# Patient Record
Sex: Male | Born: 1980 | Race: Black or African American | Hispanic: No | Marital: Married | Smoking: Never smoker
Health system: Southern US, Community
[De-identification: ages and names within clinical notes are randomized; demographics above are authoritative.]

## PROBLEM LIST (undated history)

## (undated) HISTORY — PX: BACK SURGERY: SHX140

---

## 2009-06-30 ENCOUNTER — Ambulatory Visit: Payer: Self-pay | Admitting: Diagnostic Radiology

## 2009-06-30 ENCOUNTER — Emergency Department (HOSPITAL_BASED_OUTPATIENT_CLINIC_OR_DEPARTMENT_OTHER): Admission: EM | Admit: 2009-06-30 | Discharge: 2009-06-30 | Payer: Self-pay | Admitting: Emergency Medicine

## 2010-03-31 ENCOUNTER — Ambulatory Visit (HOSPITAL_COMMUNITY): Admission: RE | Admit: 2010-03-31 | Discharge: 2010-04-01 | Payer: Self-pay | Admitting: Specialist

## 2010-09-02 ENCOUNTER — Emergency Department (HOSPITAL_COMMUNITY): Admission: EM | Admit: 2010-09-02 | Discharge: 2010-09-03 | Payer: Self-pay | Admitting: Emergency Medicine

## 2010-09-03 ENCOUNTER — Emergency Department (HOSPITAL_COMMUNITY): Admission: EM | Admit: 2010-09-03 | Discharge: 2010-09-04 | Payer: Self-pay | Admitting: Emergency Medicine

## 2011-02-08 LAB — CBC
HCT: 44.6 % (ref 39.0–52.0)
MCHC: 33 g/dL (ref 30.0–36.0)
MCV: 90.4 fL (ref 78.0–100.0)
Platelets: 207 10*3/uL (ref 150–400)
WBC: 5.7 10*3/uL (ref 4.0–10.5)

## 2011-02-08 LAB — URINALYSIS, ROUTINE W REFLEX MICROSCOPIC
Bilirubin Urine: NEGATIVE
Ketones, ur: NEGATIVE mg/dL
Nitrite: NEGATIVE
Specific Gravity, Urine: 1.007 (ref 1.005–1.030)
Urobilinogen, UA: 1 mg/dL (ref 0.0–1.0)

## 2011-02-08 LAB — COMPREHENSIVE METABOLIC PANEL
Albumin: 4.2 g/dL (ref 3.5–5.2)
BUN: 7 mg/dL (ref 6–23)
Calcium: 9.4 mg/dL (ref 8.4–10.5)
Chloride: 104 mEq/L (ref 96–112)
Creatinine, Ser: 1.14 mg/dL (ref 0.4–1.5)
GFR calc Af Amer: >60 mL/min (ref >60)
GFR calc non Af Amer: >60 mL/min (ref >60)
Total Bilirubin: 0.9 mg/dL (ref 0.3–1.2)

## 2011-02-08 LAB — PROTIME-INR: INR: 1.1 (ref 0.00–1.49)

## 2011-02-08 LAB — APTT: aPTT: 27 seconds (ref 24–37)

## 2011-04-11 IMAGING — CR DG CHEST 2V
2 series · 2 of 2 positions shown · non-contrast
Comparison: None

CLINICAL DATA: Preop for lumbar spine surgery.

CHEST - 2 VIEW

[w chest pa]
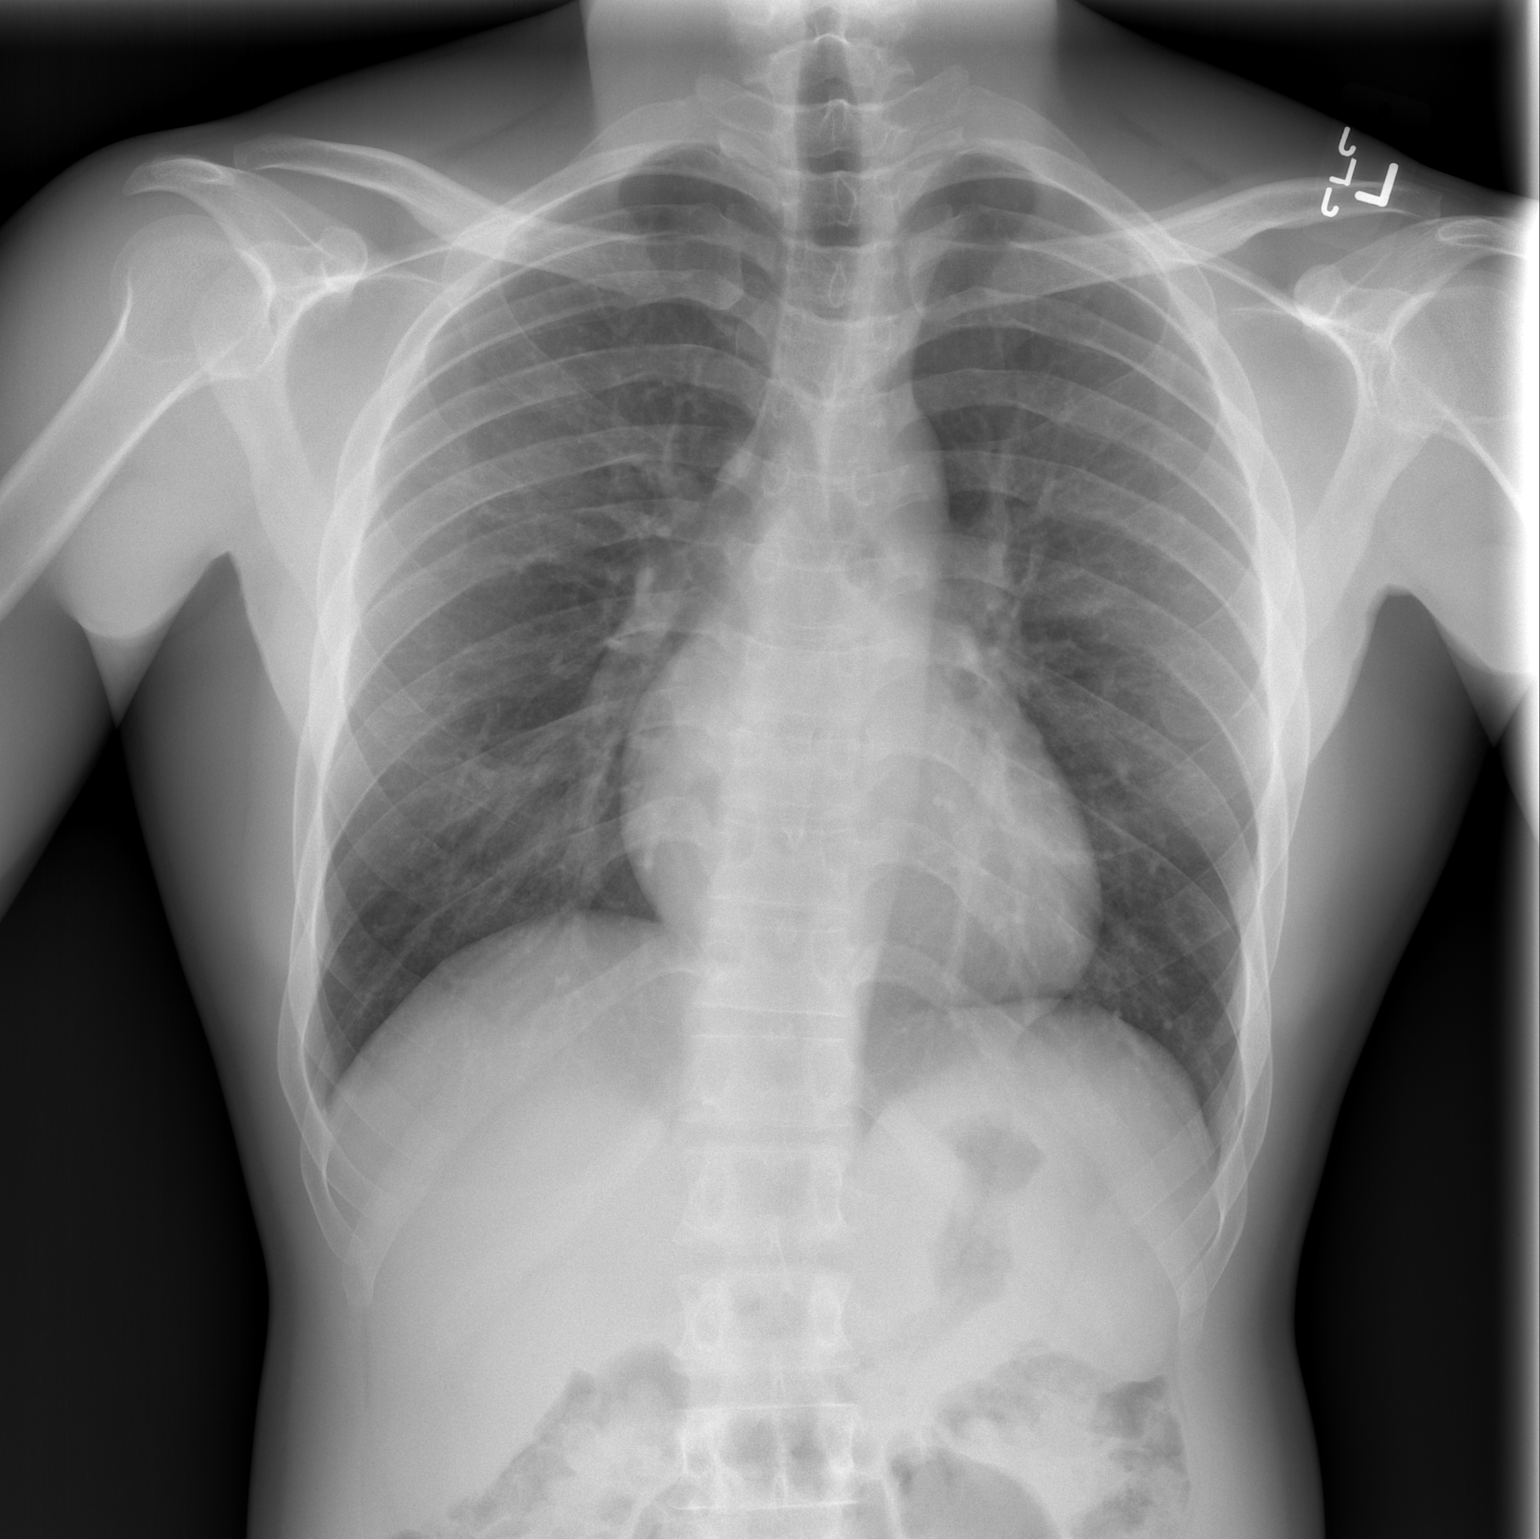

[w chest lat]
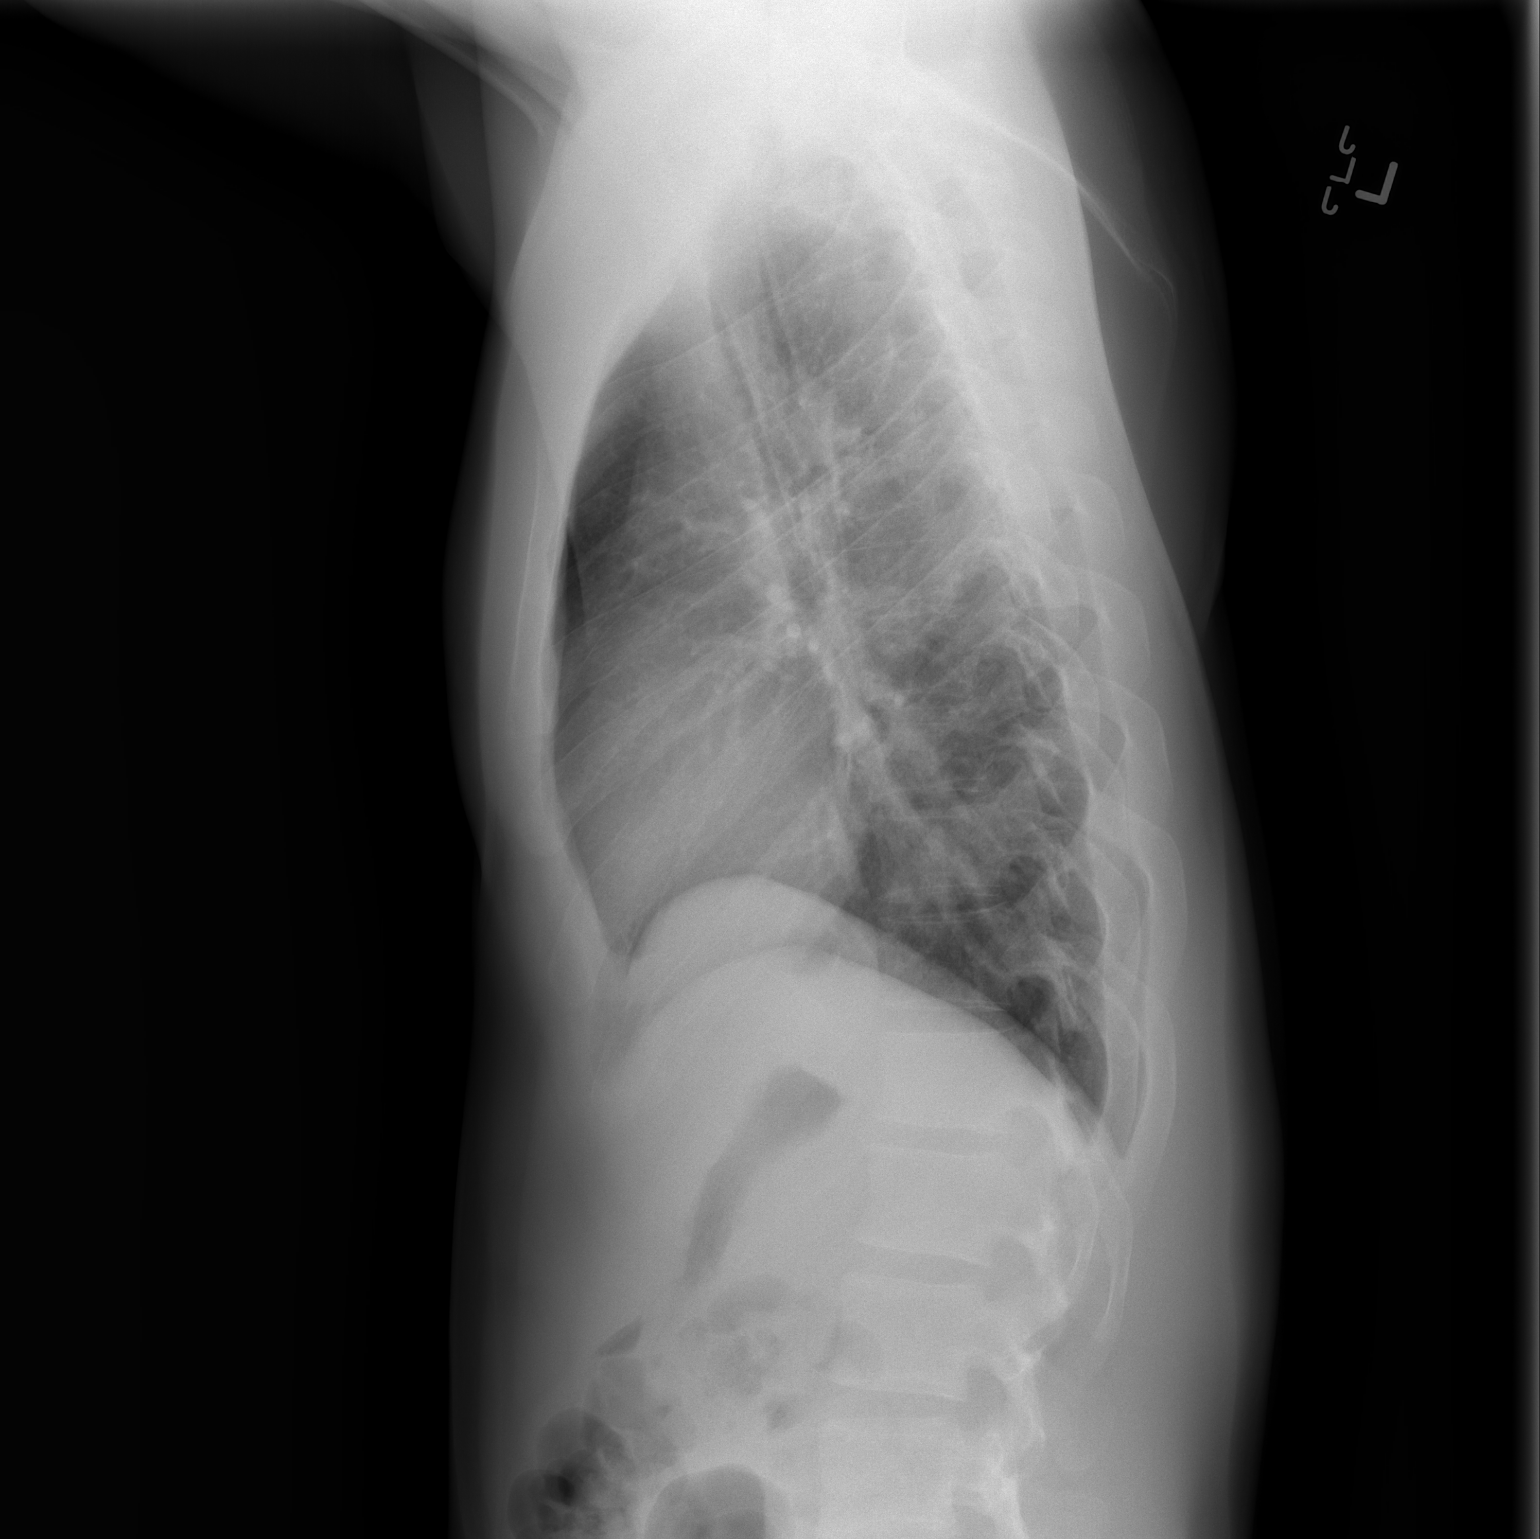

[2 of 2 positions shown; findings below may reference images not displayed]

FINDINGS: The cardiac silhouette, mediastinal and hilar contours
are within normal limits.  The lungs are clear.  The bony thorax is
intact.
IMPRESSION: Normal chest x-ray.

## 2011-04-11 IMAGING — CR DG LUMBAR SPINE 2-3V
2 series · 2 of 2 positions shown · non-contrast
Comparison: Lumbar spine series 06/30/2009

CLINICAL DATA: Preop for lumbar spine surgery.

LUMBAR SPINE - 2-3 VIEW

[t l-spine a.p.]
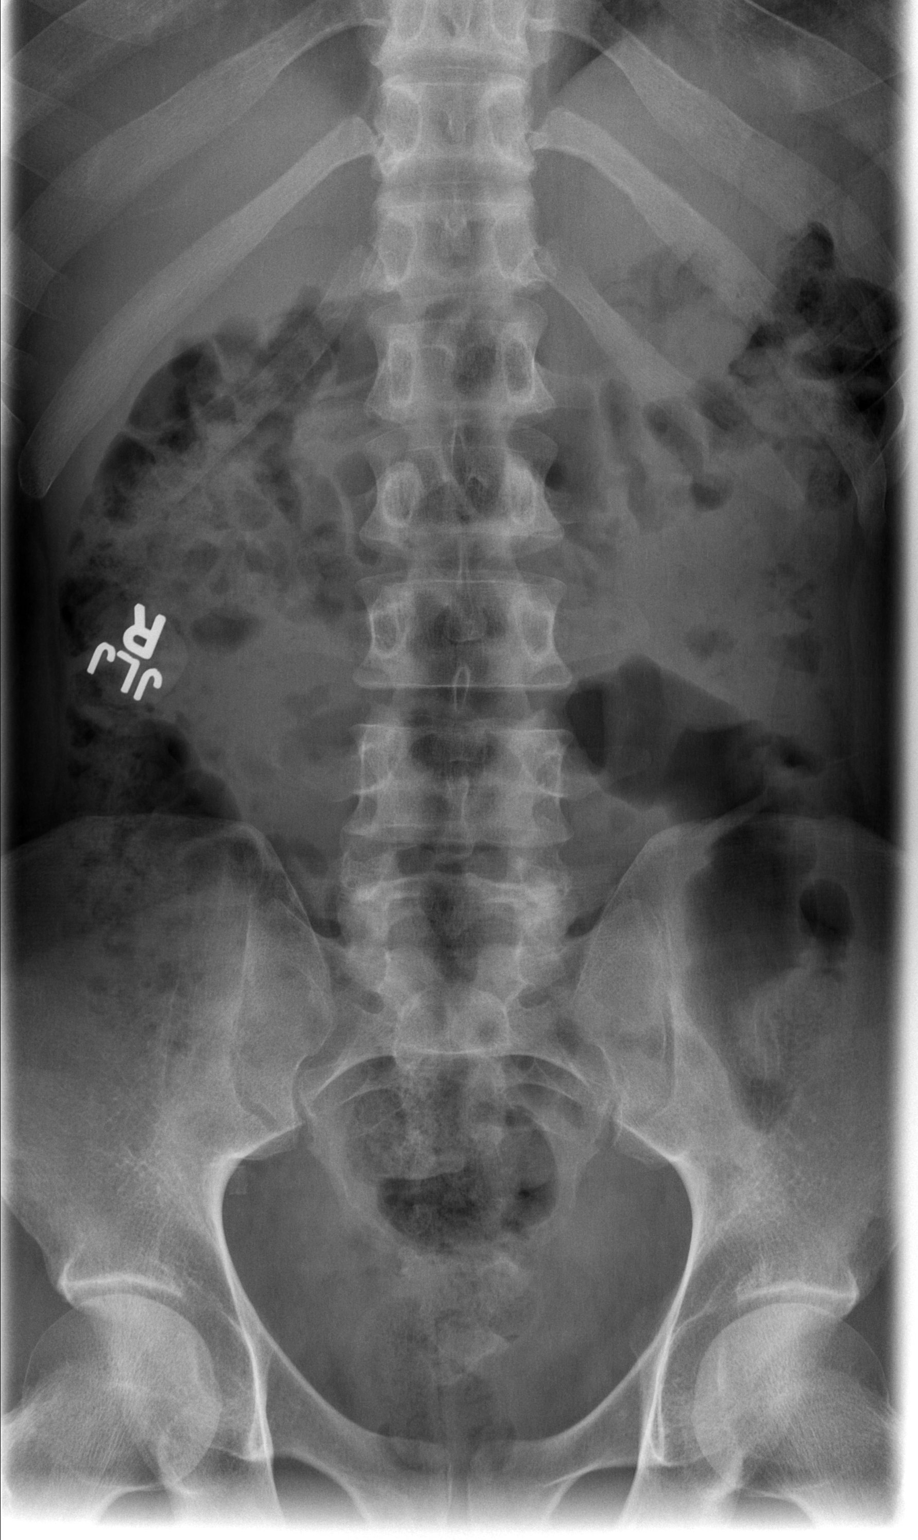

[t l-spine lat]
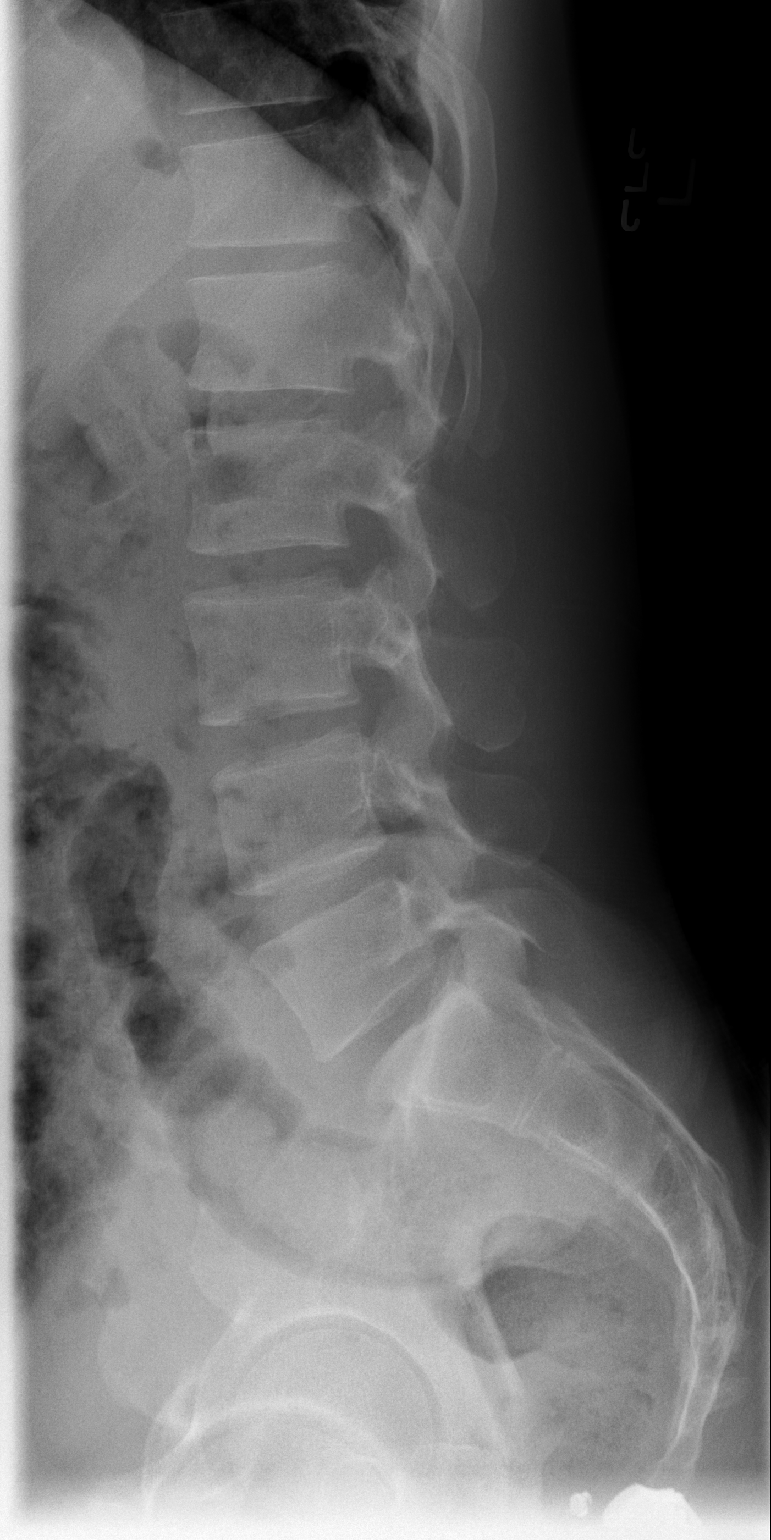

[2 of 2 positions shown; findings below may reference images not displayed]

FINDINGS: The lateral film demonstrates normal alignment.  No acute
bony findings.  There are five non-rib bearing lumbar type
vertebral bodies.  No pars defects. The bony pelvis is intact.
IMPRESSION: Normal alignment and no acute bony findings or degenerative
changes.

## 2011-04-18 IMAGING — CR DG OR PORTABLE SPINE
1 series · 1 of 1 positions shown · non-contrast
Comparison: 03/24/2010 radiographs.

CLINICAL DATA: Disc protrusion at L4-5.

PORTABLE SPINE

[series 1]
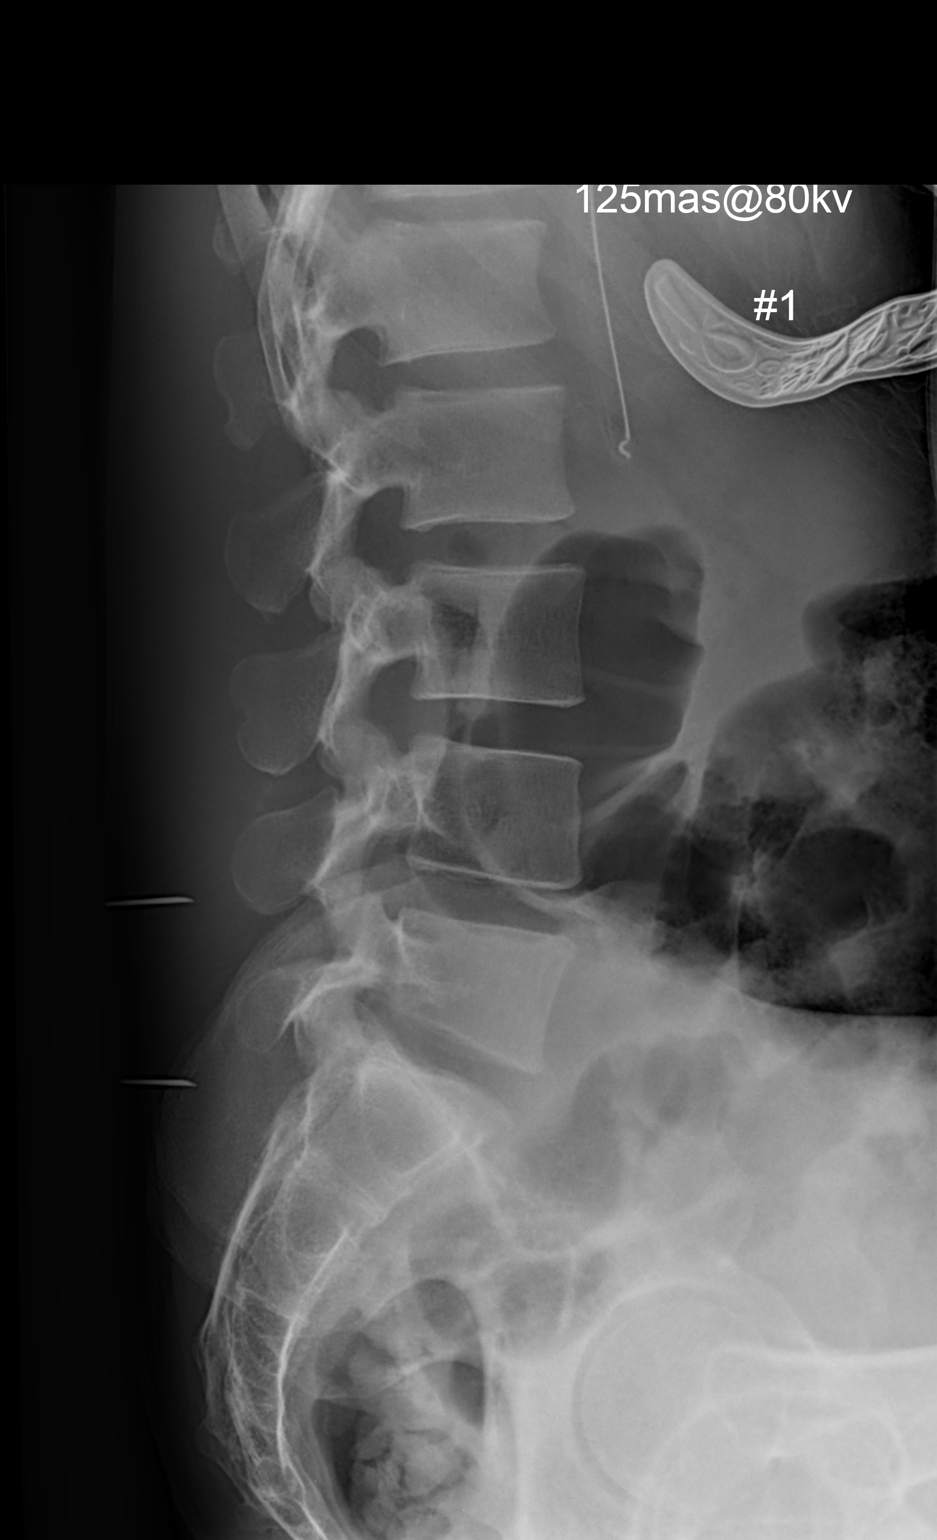

[1 of 1 positions shown; findings below may reference images not displayed]

FINDINGS: Intraoperative lateral view of the lumbar spine
demonstrates the upper needle type probe oriented towards the
inferior margin of the L4 spinous process, and also oriented
towards the L4-5 intervertebral level.  The lower needle type probe
extends below the L5 spinous process towards the back of the S1
vertebral body.
IMPRESSION: 1.  Upper probe aligns with the lower margin of the L4 spinous
process and the L4-5 intervertebral level; lower probe is oriented
towards the back of the S1 vertebral body, below the L5 spinous
process.
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
## 2012-08-24 ENCOUNTER — Telehealth: Payer: Self-pay | Admitting: Physician Assistant

## 2012-08-24 NOTE — Telephone Encounter (Signed)
Refill request for hydrocodone-acetaminophen denied.  The patient hasn't been here since 06/2011.  Needs office visit for refills.

## 2014-12-29 ENCOUNTER — Ambulatory Visit: Payer: PRIVATE HEALTH INSURANCE

## 2018-02-12 ENCOUNTER — Emergency Department (HOSPITAL_COMMUNITY)
Admission: EM | Admit: 2018-02-12 | Discharge: 2018-02-12 | Disposition: A | Discharge status: Home / Self Care | Payer: Self-pay | Attending: Emergency Medicine | Admitting: Emergency Medicine

## 2018-02-12 ENCOUNTER — Encounter (HOSPITAL_COMMUNITY): Payer: Self-pay | Admitting: Emergency Medicine

## 2018-02-12 ENCOUNTER — Emergency Department (HOSPITAL_COMMUNITY): Payer: Self-pay

## 2018-02-12 DIAGNOSIS — M545 Low back pain, unspecified: Secondary | ICD-10-CM

## 2018-02-12 DIAGNOSIS — Y929 Unspecified place or not applicable: Secondary | ICD-10-CM | POA: Insufficient documentation

## 2018-02-12 DIAGNOSIS — Y999 Unspecified external cause status: Secondary | ICD-10-CM | POA: Insufficient documentation

## 2018-02-12 DIAGNOSIS — Y939 Activity, unspecified: Secondary | ICD-10-CM | POA: Insufficient documentation

## 2018-02-12 DIAGNOSIS — M791 Myalgia, unspecified site: Secondary | ICD-10-CM

## 2018-02-12 MED ORDER — ACETAMINOPHEN 500 MG PO TABS
1000.0000 mg | ORAL_TABLET | Freq: Once | ORAL | Status: AC
Start: 2018-02-12 — End: 2018-02-12
  Administered 2018-02-12: 1000 mg via ORAL
  Filled 2018-02-12: qty 2

## 2018-02-12 MED ORDER — METHOCARBAMOL 500 MG PO TABS: 500.0000 mg | ORAL_TABLET | Freq: Two times a day (BID) | ORAL | 0 refills | Status: DC | PRN

## 2018-02-12 MED ORDER — IBUPROFEN 800 MG PO TABS
800.0000 mg | ORAL_TABLET | Freq: Once | ORAL | Status: AC
  Administered 2018-02-12: 800 mg via ORAL
  Filled 2018-02-12: qty 1

## 2018-02-12 MED ORDER — IBUPROFEN 800 MG PO TABS: 800.0000 mg | ORAL_TABLET | Freq: Three times a day (TID) | ORAL | 0 refills | Status: DC | PRN
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
## 2018-02-12 NOTE — Discharge Instructions (Signed)
It was my pleasure taking care of you today!   Fortunately, your x-rays were reassuring.   Ibuprofen as needed for pain.  Robaxin is your muscle relaxer to take as needed.  If you need additional pain relief, you can also take Tylenol.  Ice or heat to the affected areas will be helpful as well.  Follow-up with your primary care doctor if your symptoms persist longer than 1 week.  Return to the emergency department for new or worsening symptoms, any additional concerns.

## 2018-02-13 ENCOUNTER — Ambulatory Visit: Payer: PRIVATE HEALTH INSURANCE | Admitting: Urgent Care

## 2019-08-21 ENCOUNTER — Other Ambulatory Visit: Payer: Self-pay

## 2019-08-21 DIAGNOSIS — Z20822 Contact with and (suspected) exposure to covid-19: Secondary | ICD-10-CM

## 2019-08-22 LAB — NOVEL CORONAVIRUS, NAA: SARS-CoV-2, NAA: NOT DETECTED

## 2019-09-18 ENCOUNTER — Emergency Department (HOSPITAL_COMMUNITY)
Admission: EM | Admit: 2019-09-18 | Discharge: 2019-09-18 | Disposition: A | Discharge status: Home / Self Care | Payer: Self-pay | Attending: Emergency Medicine | Admitting: Emergency Medicine

## 2019-09-18 ENCOUNTER — Encounter (HOSPITAL_COMMUNITY): Payer: Self-pay

## 2019-09-18 DIAGNOSIS — Y939 Activity, unspecified: Secondary | ICD-10-CM | POA: Insufficient documentation

## 2019-09-18 DIAGNOSIS — S161XXA Strain of muscle, fascia and tendon at neck level, initial encounter: Secondary | ICD-10-CM

## 2019-09-18 DIAGNOSIS — Y999 Unspecified external cause status: Secondary | ICD-10-CM | POA: Insufficient documentation

## 2019-09-18 DIAGNOSIS — Y92410 Unspecified street and highway as the place of occurrence of the external cause: Secondary | ICD-10-CM | POA: Insufficient documentation

## 2019-09-18 DIAGNOSIS — S199XXA Unspecified injury of neck, initial encounter: Secondary | ICD-10-CM | POA: Diagnosis present

## 2019-09-18 MED ORDER — LIDOCAINE 5 % EX PTCH: 1.0000 | MEDICATED_PATCH | CUTANEOUS | 0 refills | Status: DC

## 2019-09-18 MED ORDER — BACLOFEN 10 MG PO TABS: 10.0000 mg | ORAL_TABLET | Freq: Three times a day (TID) | ORAL | 0 refills | Status: DC
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
## 2019-09-24 ENCOUNTER — Ambulatory Visit: Payer: 59 | Admitting: Family Medicine

## 2019-09-30 ENCOUNTER — Other Ambulatory Visit: Payer: Self-pay

## 2019-09-30 ENCOUNTER — Ambulatory Visit: Payer: Self-pay | Admitting: Family Medicine

## 2019-10-01 ENCOUNTER — Encounter: Payer: Self-pay | Admitting: Family Medicine

## 2019-10-01 ENCOUNTER — Ambulatory Visit: Payer: Self-pay | Admitting: Family Medicine

## 2019-10-01 VITALS — BP 98/60 | HR 92 | Temp 98.4°F | Ht 69.0 in | Wt 139.8 lb

## 2019-10-01 DIAGNOSIS — S161XXA Strain of muscle, fascia and tendon at neck level, initial encounter: Secondary | ICD-10-CM | POA: Insufficient documentation

## 2019-10-01 DIAGNOSIS — S39012A Strain of muscle, fascia and tendon of lower back, initial encounter: Secondary | ICD-10-CM | POA: Insufficient documentation

## 2019-10-01 MED ORDER — CYCLOBENZAPRINE HCL 10 MG PO TABS: 10.0000 mg | ORAL_TABLET | Freq: Every day | ORAL | 0 refills | Status: DC

## 2019-10-01 MED ORDER — LIDOCAINE 5 % EX PTCH: 1.0000 | MEDICATED_PATCH | CUTANEOUS | 0 refills | Status: DC

## 2019-10-01 MED ORDER — NAPROXEN 500 MG PO TABS: 500.0000 mg | ORAL_TABLET | Freq: Two times a day (BID) | ORAL | 0 refills | Status: DC
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
## 2019-10-01 NOTE — Patient Instructions (Signed)
Cervical Sprain  A cervical sprain is a stretch or tear in one or more of the tough, cord-like tissues that connect bones (ligaments) in the neck. Cervical sprains can range from mild to severe. Severe cervical sprains can cause the spinal bones (vertebrae) in the neck to be unstable. This can lead to spinal cord damage and can result in serious nervous system problems. The amount of time that it takes for a cervical sprain to get better depends on the cause and extent of the injury. Most cervical sprains heal in 4-6 weeks. What are the causes? Cervical sprains may be caused by an injury (trauma), such as from a motor vehicle accident, a fall, or sudden forward and backward whipping movement of the head and neck (whiplash injury). Mild cervical sprains may be caused by wear and tear over time, such as from poor posture, sitting in a chair that does not provide support, or looking up or down for long periods of time. What increases the risk? The following factors may make you more likely to develop this condition:  Participating in activities that have a high risk of trauma to the neck. These include contact sports, auto racing, gymnastics, and diving.  Taking risks when driving or riding in a motor vehicle, such as speeding.  Having osteoarthritis of the spine.  Having poor strength and flexibility of the neck.  A previous neck injury.  Having poor posture.  Spending a lot of time in certain positions that put stress on the neck, such as sitting at a computer for long periods of time. What are the signs or symptoms? Symptoms of this condition include:  Pain, soreness, stiffness, tenderness, swelling, or a burning sensation in the front, back, or sides of the neck.  Sudden tightening of neck muscles that you cannot control (muscle spasms).  Pain in the shoulders or upper back.  Limited ability to move the neck.  Headache.  Dizziness.  Nausea.  Vomiting.  Weakness, numbness,  or tingling in a hand or an arm. Symptoms may develop right away after injury, or they may develop over a few days. In some cases, symptoms may go away with treatment and return (recur) over time. How is this diagnosed? This condition may be diagnosed based on:  Your medical history.  Your symptoms.  Any recent injuries or known neck problems that you have, such as arthritis in the neck.  A physical exam.  Imaging tests, such as: ? X-rays. ? MRI. ? CT scan. How is this treated? This condition is treated by resting and icing the injured area and doing physical therapy exercises. Depending on the severity of your condition, treatment may also include:  Keeping your neck in place (immobilized) for periods of time. This may be done using: ? A cervical collar. This supports your chin and the back of your head. ? A cervical traction device. This is a sling that holds up your head. This removes weight and pressure from your neck, and it may help to relieve pain.  Medicines that help to relieve pain and inflammation.  Medicines that help to relax your muscles (muscle relaxants).  Surgery. This is rare. Follow these instructions at home: If you have a cervical collar:   Wear it as told by your health care provider. Do not remove the collar unless instructed by your health care provider.  Ask your health care provider before you make any adjustments to your collar.  If you have long hair, keep it outside of the   collar.  Ask your health care provider if you can remove the collar for cleaning and bathing. If you are allowed to remove the collar for cleaning or bathing: ? Follow instructions from your health care provider about how to remove the collar safely. ? Clean the collar by wiping it with mild soap and water and drying it completely. ? If your collar has removable pads, remove them every 1-2 days and wash them by hand with soap and water. Let them air-dry completely before you  put them back in the collar. ? Check your skin under the collar for irritation or sores. If you see any, tell your health care provider. Managing pain, stiffness, and swelling   If directed, use a cervical traction device as told by your health care provider.  If directed, apply heat to the affected area before you do your physical therapy or as often as told by your health care provider. Use the heat source that your health care provider recommends, such as a moist heat pack or a heating pad. ? Place a towel between your skin and the heat source. ? Leave the heat on for 20-30 minutes. ? Remove the heat if your skin turns bright red. This is especially important if you are unable to feel pain, heat, or cold. You may have a greater risk of getting burned.  If directed, put ice on the affected area: ? Put ice in a plastic bag. ? Place a towel between your skin and the bag. ? Leave the ice on for 20 minutes, 2-3 times a day. Activity  Do not drive while wearing a cervical collar. If you do not have a cervical collar, ask your health care provider if it is safe to drive while your neck heals.  Do not drive or use heavy machinery while taking prescription pain medicine or muscle relaxants, unless your health care provider approves.  Do not lift anything that is heavier than 10 lb (4.5 kg) until your health care provider tells you that it is safe.  Rest as directed by your health care provider. Avoid positions and activities that make your symptoms worse. Ask your health care provider what activities are safe for you.  If physical therapy was prescribed, do exercises as told by your health care provider or physical therapist. General instructions  Take over-the-counter and prescription medicines only as told by your health care provider.  Do not use any products that contain nicotine or tobacco, such as cigarettes and e-cigarettes. These can delay healing. If you need help quitting, ask your  health care provider.  Keep all follow-up visits as told by your health care provider or physical therapist. This is important. How is this prevented? To prevent a cervical sprain from happening again:  Use and maintain good posture. Make any needed adjustments to your workstation to help you use good posture.  Exercise regularly as directed by your health care provider or physical therapist.  Avoid risky activities that may cause a cervical sprain. Contact a health care provider if:  You have symptoms that get worse or do not get better after 2 weeks of treatment.  You have pain that gets worse or does not get better with medicine.  You develop new, unexplained symptoms.  You have sores or irritated skin on your neck from wearing your cervical collar. Get help right away if:  You have severe pain.  You develop numbness, tingling, or weakness in any part of your body.  You cannot move   a part of your body (you have paralysis).  You have neck pain along with: ? Severe dizziness. ? Headache. Summary  A cervical sprain is a stretch or tear in one or more of the tough, cord-like tissues that connect bones (ligaments) in the neck.  Cervical sprains may be caused by an injury (trauma), such as from a motor vehicle accident, a fall, or sudden forward and backward whipping movement of the head and neck (whiplash injury).  Symptoms may develop right away after injury, or they may develop over a few days.  This condition is treated by resting and icing the injured area and doing physical therapy exercises. This information is not intended to replace advice given to you by your health care provider. Make sure you discuss any questions you have with your health care provider. Document Released: 09/04/2007 Document Revised: 02/27/2019 Document Reviewed: 07/06/2016 Elsevier Patient Education  2020 [place].  Lumbar Strain A lumbar strain, which is sometimes called a low-back  strain, is a stretch or tear in a muscle or the strong cords of tissue that attach muscle to bone (tendons) in the lower back (lumbar spine). This type of injury occurs when muscles or tendons are torn or are stretched beyond their limits. Lumbar strains can range from mild to severe. Mild strains may involve stretching a muscle or tendon without tearing it. These may heal in 1-2 weeks. More severe strains involve tearing of muscle fibers or tendons. These will cause more pain and may take 6-8 weeks to heal. What are the causes? This condition may be caused by:  Trauma, such as a fall or a hit to the body.  Twisting or overstretching the back. This may result from doing activities that need a lot of energy, such as lifting heavy objects. What increases the risk? This injury is more common in:  Athletes.  People with obesity.  People who do repeated lifting, bending, or other movements that involve their back. What are the signs or symptoms? Symptoms of this condition may include:  Sharp or dull pain in the lower back that does not go away. The pain may extend to the buttocks.  Stiffness or limited range of motion.  Sudden muscle tightening (spasms). How is this diagnosed? This condition may be diagnosed based on:  Your symptoms.  Your medical history.  A physical exam.  Imaging tests, such as: ? X-rays. ? MRI. How is this treated? Treatment for this condition may include:  Rest.  Applying heat and cold to the affected area.  Over-the-counter medicines to help relieve pain and inflammation, such as NSAIDs.  Prescription pain medicine and muscle relaxants may be needed for a short time.  Physical therapy. Follow these instructions at home: Managing pain, stiffness, and swelling      If directed, put ice on the injured area during the first 24 hours after your injury. ? Put ice in a plastic bag. ? Place a towel between your skin and the bag. ? Leave the ice on  for 20 minutes, 2-3 times a day.  If directed, apply heat to the affected area as often as told by your health care provider. Use the heat source that your health care provider recommends, such as a moist heat pack or a heating pad. ? Place a towel between your skin and the heat source. ? Leave the heat on for 20-30 minutes. ? Remove the heat if your skin turns bright red. This is especially important if you are unable to  feel pain, heat, or cold. You may have a greater risk of getting burned. Activity  Rest and return to your normal activities as told by your health care provider. Ask your health care provider what activities are safe for you.  Do exercises as told by your health care provider. Medicines  Take over-the-counter and prescription medicines only as told by your health care provider.  Ask your health care provider if the medicine prescribed to you: ? Requires you to avoid driving or using heavy machinery. ? Can cause constipation. You may need to take these actions to prevent or treat constipation:  Drink enough fluid to keep your urine pale yellow.  Take over-the-counter or prescription medicines.  Eat foods that are high in fiber, such as beans, whole grains, and fresh fruits and vegetables.  Limit foods that are high in fat and processed sugars, such as fried or sweet foods. Injury prevention To prevent a future low-back injury:  Always warm up properly before physical activity or sports.  Cool down and stretch after being active.  Use correct form when playing sports and lifting heavy objects. Bend your knees before you lift heavy objects.  Use good posture when sitting and standing.  Stay physically fit and keep a healthy weight. ? Do at least 150 minutes of moderate-intensity exercise each week, such as brisk walking or water aerobics. ? Do strength exercises at least 2 times each week.  General instructions  Do not use any products that contain nicotine  or tobacco, such as cigarettes, e-cigarettes, and chewing tobacco. If you need help quitting, ask your health care provider.  Keep all follow-up visits as told by your health care provider. This is important. Contact a health care provider if:  Your back pain does not improve after 6 weeks of treatment.  Your symptoms get worse. Get help right away if:  Your back pain is severe.  You are unable to stand or walk.  You develop pain in your legs.  You develop weakness in your buttocks or legs.  You have difficulty controlling when you urinate or when you have a bowel movement. ? You have frequent, painful, or bloody urination. ? You have a temperature over 101.58F (38.3C) Summary  A lumbar strain, which is sometimes called a low-back strain, is a stretch or tear in a muscle or the strong cords of tissue that attach muscle to bone (tendons) in the lower back (lumbar spine).  This type of injury occurs when muscles or tendons are torn or are stretched beyond their limits.  Rest and return to your normal activities as told by your health care provider. If directed, apply heat and ice to the affected area as often as told by your health care provider.  Take over-the-counter and prescription medicines only as told by your health care provider.  Contact a health care provider if you have new or worsening symptoms. This information is not intended to replace advice given to you by your health care provider. Make sure you discuss any questions you have with your health care provider. Document Released: 11/07/2005 Document Revised: 09/06/2018 Document Reviewed: 09/06/2018 Elsevier Patient Education  2020 [employer].

## 2019-10-02 ENCOUNTER — Other Ambulatory Visit: Payer: Self-pay

## 2019-10-02 DIAGNOSIS — Z20822 Contact with and (suspected) exposure to covid-19: Secondary | ICD-10-CM

## 2019-10-04 LAB — NOVEL CORONAVIRUS, NAA: SARS-CoV-2, NAA: NOT DETECTED

## 2022-10-10 ENCOUNTER — Emergency Department (HOSPITAL_COMMUNITY): Payer: Commercial Managed Care - HMO

## 2022-10-10 ENCOUNTER — Encounter (HOSPITAL_COMMUNITY): Payer: Self-pay

## 2022-10-10 ENCOUNTER — Other Ambulatory Visit: Payer: Self-pay

## 2022-10-10 ENCOUNTER — Emergency Department (HOSPITAL_COMMUNITY)
Admission: EM | Admit: 2022-10-10 | Discharge: 2022-10-10 | Disposition: A | Discharge status: Home / Self Care | Payer: Commercial Managed Care - HMO | Attending: Emergency Medicine | Admitting: Emergency Medicine

## 2022-10-10 DIAGNOSIS — M545 Low back pain, unspecified: Secondary | ICD-10-CM | POA: Insufficient documentation

## 2022-10-10 DIAGNOSIS — M25552 Pain in left hip: Secondary | ICD-10-CM | POA: Insufficient documentation

## 2022-10-10 DIAGNOSIS — S161XXA Strain of muscle, fascia and tendon at neck level, initial encounter: Secondary | ICD-10-CM | POA: Insufficient documentation

## 2022-10-10 DIAGNOSIS — S199XXA Unspecified injury of neck, initial encounter: Secondary | ICD-10-CM | POA: Diagnosis present

## 2022-10-10 DIAGNOSIS — Y92410 Unspecified street and highway as the place of occurrence of the external cause: Secondary | ICD-10-CM | POA: Diagnosis not present

## 2022-10-10 MED ORDER — METHOCARBAMOL 500 MG PO TABS: 500.0000 mg | ORAL_TABLET | Freq: Two times a day (BID) | ORAL | 0 refills | Status: DC
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
## 2022-11-08 ENCOUNTER — Encounter (HOSPITAL_COMMUNITY): Payer: Self-pay | Admitting: *Deleted

## 2022-11-08 ENCOUNTER — Ambulatory Visit (HOSPITAL_COMMUNITY)
Admission: EM | Admit: 2022-11-08 | Discharge: 2022-11-08 | Disposition: A | Discharge status: Home / Self Care | Payer: Commercial Managed Care - HMO | Attending: Family Medicine | Admitting: Family Medicine

## 2022-11-08 DIAGNOSIS — M542 Cervicalgia: Secondary | ICD-10-CM | POA: Diagnosis not present

## 2022-11-08 MED ORDER — KETOROLAC TROMETHAMINE 30 MG/ML IJ SOLN
30.0000 mg | Freq: Once | INTRAMUSCULAR | Status: AC
  Administered 2022-11-08: 30 mg via INTRAMUSCULAR

## 2022-11-08 MED ORDER — NAPROXEN 500 MG PO TABS: 500.0000 mg | ORAL_TABLET | Freq: Two times a day (BID) | ORAL | 0 refills | Status: AC | PRN

## 2022-11-08 MED ORDER — KETOROLAC TROMETHAMINE 30 MG/ML IJ SOLN
INTRAMUSCULAR | Status: AC
  Filled 2022-11-08: qty 1

## 2022-11-08 MED ORDER — TIZANIDINE HCL 4 MG PO TABS: 4.0000 mg | ORAL_TABLET | Freq: Three times a day (TID) | ORAL | 0 refills | Status: AC | PRN

## 2022-11-08 NOTE — ED Triage Notes (Signed)
Pt states he has neck pain that radiates down his arms it started yesterday and he has been taking IBU without relief. No knoq injury.
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
